# Patient Record
Sex: Female | Born: 1955 | Race: White | Hispanic: No | Marital: Married | Smoking: Never smoker
Health system: Southern US, Community
[De-identification: ages and names within clinical notes are randomized; demographics above are authoritative.]

## PROBLEM LIST (undated history)

## (undated) DIAGNOSIS — C50919 Malignant neoplasm of unspecified site of unspecified female breast: Secondary | ICD-10-CM

## (undated) DIAGNOSIS — M069 Rheumatoid arthritis, unspecified: Secondary | ICD-10-CM

## (undated) HISTORY — PX: BREAST LUMPECTOMY: SHX2

## (undated) HISTORY — DX: Malignant neoplasm of unspecified site of unspecified female breast: C50.919

## (undated) HISTORY — PX: VAGINAL HYSTERECTOMY: SUR661

## (undated) HISTORY — DX: Rheumatoid arthritis, unspecified: M06.9

---

## 2015-08-15 ENCOUNTER — Telehealth: Payer: Self-pay | Admitting: Oncology

## 2015-08-15 NOTE — Telephone Encounter (Signed)
new patient appt-s/w patient and gave np appt for 02/08 @ 4 w/Dr. Jana Hakim.  Patient new to area from [place] scanned under media. Spoke with Katharine Look @ [facility] to request additional medical records [phone].

## 2015-08-19 ENCOUNTER — Telehealth: Payer: Self-pay | Admitting: Oncology

## 2015-08-19 NOTE — Telephone Encounter (Signed)
Spoke with [facility] and they have sent all of the records on the pt that they have. Office confirmed that mm in notes was not completed by their office but a Dr. Mack Hook and that pt had no chem TX under their care.

## 2015-08-20 ENCOUNTER — Other Ambulatory Visit: Payer: Self-pay

## 2015-08-20 DIAGNOSIS — C50919 Malignant neoplasm of unspecified site of unspecified female breast: Secondary | ICD-10-CM

## 2015-08-21 ENCOUNTER — Ambulatory Visit (HOSPITAL_BASED_OUTPATIENT_CLINIC_OR_DEPARTMENT_OTHER): Payer: 59 | Admitting: Oncology

## 2015-08-21 ENCOUNTER — Other Ambulatory Visit: Payer: Self-pay | Admitting: *Deleted

## 2015-08-21 ENCOUNTER — Other Ambulatory Visit (HOSPITAL_BASED_OUTPATIENT_CLINIC_OR_DEPARTMENT_OTHER): Payer: 59

## 2015-08-21 ENCOUNTER — Encounter: Payer: Self-pay | Admitting: Oncology

## 2015-08-21 VITALS — BP 130/80 | HR 85 | Temp 98.4°F | Resp 18 | Ht 68.0 in | Wt 182.7 lb

## 2015-08-21 DIAGNOSIS — M05742 Rheumatoid arthritis with rheumatoid factor of left hand without organ or systems involvement: Secondary | ICD-10-CM

## 2015-08-21 DIAGNOSIS — Z853 Personal history of malignant neoplasm of breast: Secondary | ICD-10-CM

## 2015-08-21 DIAGNOSIS — M05741 Rheumatoid arthritis with rheumatoid factor of right hand without organ or systems involvement: Secondary | ICD-10-CM

## 2015-08-21 DIAGNOSIS — M069 Rheumatoid arthritis, unspecified: Secondary | ICD-10-CM

## 2015-08-21 DIAGNOSIS — C50919 Malignant neoplasm of unspecified site of unspecified female breast: Secondary | ICD-10-CM

## 2015-08-21 DIAGNOSIS — C50411 Malignant neoplasm of upper-outer quadrant of right female breast: Secondary | ICD-10-CM

## 2015-08-21 LAB — COMPREHENSIVE METABOLIC PANEL
ALT: 23 U/L (ref 0–55)
ANION GAP: 10 meq/L (ref 3–11)
AST: 18 U/L (ref 5–34)
Albumin: 4.2 g/dL (ref 3.5–5.0)
Alkaline Phosphatase: 93 U/L (ref 40–150)
BUN: 14.6 mg/dL (ref 7.0–26.0)
CHLORIDE: 106 meq/L (ref 98–109)
CO2: 25 meq/L (ref 22–29)
Calcium: 9.3 mg/dL (ref 8.4–10.4)
Creatinine: 0.7 mg/dL (ref 0.6–1.1)
EGFR: 89 mL/min/{1.73_m2} — AB (ref >90)
Glucose: 109 mg/dl (ref 70–140)
Potassium: 4.2 mEq/L (ref 3.5–5.1)
Sodium: 141 mEq/L (ref 136–145)
Total Bilirubin: <0.3 mg/dL (ref 0.20–1.20)
Total Protein: 6.6 g/dL (ref 6.4–8.3)

## 2015-08-21 LAB — CBC WITH DIFFERENTIAL/PLATELET
BASO%: 0.6 % (ref 0.0–2.0)
BASOS ABS: 0 10*3/uL (ref 0.0–0.1)
EOS ABS: 0.1 10*3/uL (ref 0.0–0.5)
EOS%: 1.1 % (ref 0.0–7.0)
HCT: 39.3 % (ref 34.8–46.6)
HEMOGLOBIN: 12.9 g/dL (ref 11.6–15.9)
LYMPH%: 35.2 % (ref 14.0–49.7)
MCH: 28.2 pg (ref 25.1–34.0)
MCHC: 32.8 g/dL (ref 31.5–36.0)
MCV: 86.1 fL (ref 79.5–101.0)
MONO#: 0.6 10*3/uL (ref 0.1–0.9)
MONO%: 7.9 % (ref 0.0–14.0)
NEUT%: 55.2 % (ref 38.4–76.8)
NEUTROS ABS: 4.3 10*3/uL (ref 1.5–6.5)
PLATELETS: 287 10*3/uL (ref 145–400)
RBC: 4.57 10*6/uL (ref 3.70–5.45)
RDW: 12.9 % (ref 11.2–14.5)
WBC: 7.8 10*3/uL (ref 3.9–10.3)
lymph#: 2.7 10*3/uL (ref 0.9–3.3)

## 2015-08-21 NOTE — Progress Notes (Signed)
[place]  [phone] [phone]     ID: Jaclyn Vaughan DOB: 03/14/1956  MR#: [id number]  LPF#:[id number]  Patient Care Team: Chauncey Cruel, MD as Consulting Physician (Oncology) Ted Mcalpine, MD (Internal Medicine) PCP: No primary care provider on file. GYN: SU:  OTHER MD:  CHIEF COMPLAINT: Estrogen receptor positive breast cancer  CURRENT TREATMENT: Observation   BREAST CANCER HISTORY: Jaclyn Vaughan had a spiculated lesion noted in her right breast by routine mammography February 2011. Further imaging including MRI showed only a single focus measuring 1.3 cm and biopsy 10/14/2009 confirmed invasive ductal carcinoma, grade 1, and DCIS. Margins were very close. Estrogen and progesterone receptors were positive. HER-2 was negative. On 11/14/2009 she underwent right lumpectomy with sentinel lymph node biopsy with negative margins and negative lymph nodes. -- This information comes from Dr Dan Europe 05/13/2011 note from [facility]. I do not have the actual pathology  The patient received adjuvant radiation and then started anastrozole June 2011. At that time the patient was 60 years old, her estradiol level was less than 20 and her [name] was more than 75. She had a normal bone density at baseline. She continued letrozole through June 2016, when it was discontinued.  Her subsequent history is as detailed below  INTERVAL HISTORY: [name] recently moved to [place] to be closer to family. She was evaluated in the breast clinic 08/21/2015. She is establishing herself on my service today.  REVIEW OF SYSTEMS: She has a history of rheumatoid arthritis which was treated with rituximab at [facility]. She tells me this was very successful and she has been off medication for 5 years with no significant pains. Recently she and her husband travel to [place] where they did quite a bit of walking. She found that when there was a slope she was  significantly short of breath. She had no anginal symptoms. She was not aware of palpitations. She does have some arthritic changes of course and she has a history of depression, which is not currently active. A detailed review of systems today was otherwise entirely negative.  PAST MEDICAL HISTORY: Past Medical History  Diagnosis Date  . Breast cancer ([place])   . Rheumatoid arthritis ([place])     PAST SURGICAL HISTORY: Past Surgical History  Procedure Laterality Date  . Breast lumpectomy Right   . Vaginal hysterectomy      No salpingo-oophorectomy    FAMILY HISTORY No family history on file. The patient's father died from a heart attack at the age of 23, in the setting of continuing tobacco abuse. The patient's mother died after undergoing cholecystectomy in the setting of morbid obesity. The patient has 4 brothers, one sister. Her sister had triple negative breast cancer diagnosed in her early 23s. She has been tested for the BRCA genes and was negative  GYNECOLOGIC HISTORY:  No LMP recorded. Menarche age 33, first live birth age 9, the patient is Jaclyn Vaughan. She underwent simple hysterectomy in 2005. She did not take hormone replacement.   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 Her husband Jaclyn Vaughan was Engineer, maintenance of a Media planner company but is currently retired. Son Jaclyn Vaughan lives in [place] where he works as a Manufacturing engineer. Jaclyn Vaughan lives in [place] and works as a Environmental education officer. The patient has no grandchildren. She attends the [employer] DIRECTIVES: Not in  place   HEALTH MAINTENANCE: Social History  Substance Use Topics  . Smoking status: Not on file  . Smokeless tobacco: Not on file  . Alcohol Use: Not on file     Colonoscopy: Never  YTK:[id number] post hysterectomy  Bone density: Remote  Lipid panel:  No Known  Allergies  No current outpatient prescriptions on file.   No current facility-administered medications for this visit.    OBJECTIVE: Middle-aged white woman who appears stated age  60 Vitals:   08/21/15 1615  BP: 130/80  Vaughan: 85  Temp: 98.4 F (36.9 C)  Resp: 18     Body mass index is 27.79 kg/(m^2).    ECOG FS:1 - Symptomatic but completely ambulatory  Ocular: Sclerae unicteric, pupils equal, round and reactive to light Ear-nose-throat: Oropharynx clear and moist Lymphatic: No cervical or supraclavicular adenopathy Lungs no rales or rhonchi, good excursion bilaterally Heart regular rate and rhythm, no murmur appreciated Abd soft, nontender, positive bowel sounds MSK no focal spinal tenderness, bilateral MCP/PIP joint deformities consistent with RA, without erythema or tenderness  Neuro: non-focal, well-oriented, appropriate affect Breasts: The right breast is status post lumpectomy and radiation. The cosmetic result is good. There is no evidence of local recurrence. The right axilla is benign. The left breast is unremarkable   LAB RESULTS:  CMP     Component Value Date/Time   NA 141 08/21/2015 1603   K 4.2 08/21/2015 1603   CO2 25 08/21/2015 1603   GLUCOSE 109 08/21/2015 1603   BUN 14.6 08/21/2015 1603   CREATININE 0.7 08/21/2015 1603   CALCIUM 9.3 08/21/2015 1603   PROT 6.6 08/21/2015 1603   ALBUMIN 4.2 08/21/2015 1603   AST 18 08/21/2015 1603   ALT 23 08/21/2015 1603   ALKPHOS 93 08/21/2015 1603   BILITOT <0.30 08/21/2015 1603    INo results found for: SPEP, UPEP  Lab Results  Component Value Date   WBC 7.8 08/21/2015   NEUTROABS 4.3 08/21/2015   HGB 12.9 08/21/2015   HCT 39.3 08/21/2015   MCV 86.1 08/21/2015   PLT 287 08/21/2015      Chemistry      Component Value Date/Time   NA 141 08/21/2015 1603   K 4.2 08/21/2015 1603   CO2 25 08/21/2015 1603   BUN 14.6 08/21/2015 1603   CREATININE 0.7 08/21/2015 1603      Component Value Date/Time    CALCIUM 9.3 08/21/2015 1603   ALKPHOS 93 08/21/2015 1603   AST 18 08/21/2015 1603   ALT 23 08/21/2015 1603   BILITOT <0.30 08/21/2015 1603       No results found for: LABCA2  No components found for: LABCA125  No results for input(s): INR in the last 168 hours.  Urinalysis No results found for: COLORURINE, APPEARANCEUR, LABSPEC, PHURINE, GLUCOSEU, HGBUR, BILIRUBINUR, KETONESUR, PROTEINUR, UROBILINOGEN, NITRITE, LEUKOCYTESUR  STUDIES: No results found.  ASSESSMENT: 60 y.o. [place] woman status post right lumpectomy and sentinel lymph node sampling May 2011 for a pT1c, pN0, stage IA invasive ductal carcinoma, grade 1, estrogen and progesterone receptor positive, HER-2 not amplified  (a) status post adjuvant radiation  (b) on letrozole between June 2011 and June 2016  (1) rheumatoid arthritis, status post rituximab treatment at [facility], currently not on therapy  (2) status post hysterectomy without salpingo-oophorectomy  PLAN: I spent approximately 50 minutes with Jaclyn Vaughan reviewing her data. She had thought she did not have invasive disease and did not remember that lymph nodes were removed from the  right axilla. We discussed the difference between invasive and noninvasive disease and she understands all the word "invasive" means is that the cancer cells which started in a Dr. got out of the duct. It does not mean the cancer was invading all over her body at the time of diagnosis.  After completing local treatment with surgery and radiation, the options for systemic therapy included chemotherapy and anti-estrogens. Because her cancer was not only small but also grade 1, chemotherapy was appropriately not commended. Today we would've sent an Oncotype and likely would have confirmed that this was a "low-risk" tumor and that chemotherapy would have reduce her risk of recurrence minimally if at all of course she was not a candidate for anti-HER-2 treatment  She was treated with  letrozole for 5 years. This essentially cut in half the risk of local recurrence, the risk of systemic recurrence, and also the risk of her developing another breast cancer in either breast.  We now have data for using aromatase inhibitors an additional 5 years. This further reduce his risk by approximately 3%. However only part of that risk reduction is in systemic recurrence. The actual benefit in that sense is only 1%. Accordingly I am very comfortable with her having stopped letrozole at 5 years.  At this point she needs to update her health maintenance and the first step will be to establish yourself with a primary care physician in this area. She is planning to contact her husband's primary care physician.once that is done she should be referred for colonoscopy, which she has not yet had. It would not be a bad idea for her to have formal dermatologic follow-up as well since she is a fair woman living in the [place]. She does not need gynecologic follow-up being status post   hysterectomy  She also needs to establish herself with a rheumatologist. She had a very good rapport with Dr. Elwyn Reach at [facility] and I will see if his clinic would agree to an appointment for her with.  The only concern I have after today's visit is that she does report some shortness of breath with mild to moderate exertion. I'm going to obtain a chest x-ray which will serve as baseline. She also needs a bone density and mammography with tomography and all those have been operationalized.  I have set her up to see me again in October, but if she is established with her physicians and everything is in order she may cancel that appointment: I usually do not follow low-risk patients like herself beyond the first 5 years or until they complete their antiestrogen treatment  The patient has a good understanding of the overall plan. She agrees with it. She knows the goal of treatment in her case is cure. She will call with any problems  that may develop before her next visit here.  Chauncey Cruel, MD   08/21/2015 6:06 PM Medical Oncology and Hematology [facility] [address] [place], [place] [address] Tel. [phone]    Fax. [phone]

## 2015-08-22 ENCOUNTER — Other Ambulatory Visit: Payer: Self-pay | Admitting: *Deleted

## 2015-08-22 ENCOUNTER — Telehealth: Payer: Self-pay | Admitting: *Deleted

## 2015-08-22 NOTE — Telephone Encounter (Signed)
This RN contacted Dr Elwyn Reach - rheumatologist at [facility] to obtain appointment at [phone].  This RN spoke with Nevin Bloodgood in scheduling who noted pt last seen by this MD in 2011.  Requested referral to be faxed to [phone] with note pt previously seen by above MD.  This RN faxed requested records to above.

## 2015-08-23 ENCOUNTER — Other Ambulatory Visit: Payer: Self-pay | Admitting: Oncology

## 2015-08-23 DIAGNOSIS — C50411 Malignant neoplasm of upper-outer quadrant of right female breast: Secondary | ICD-10-CM

## 2015-08-24 ENCOUNTER — Telehealth: Payer: Self-pay | Admitting: Oncology

## 2015-08-24 NOTE — Telephone Encounter (Signed)
Spoke with patient re f/u 8/17. Request for appointment with rheumatologist at [facility] has already been initiated by desk nurse see previous nurses note copied below - patient aware.      Laureen Abrahams, RN at 08/22/2015 3:16 PM     Status: Signed       Expand All Collapse All   This RN contacted Dr Elwyn Reach - rheumatologist at [facility] to obtain appointment at [phone].  This RN spoke with Nevin Bloodgood in scheduling who noted pt last seen by this MD in 2011.  Requested referral to be faxed to [phone] with note pt previously seen by above MD.  This RN faxed requested records to above.

## 2015-09-12 ENCOUNTER — Inpatient Hospital Stay: Admission: RE | Admit: 2015-09-12 | Payer: 59 | Source: Ambulatory Visit

## 2015-09-12 ENCOUNTER — Ambulatory Visit
Admission: RE | Admit: 2015-09-12 | Discharge: 2015-09-12 | Disposition: A | Discharge status: Home / Self Care | Payer: 59 | Source: Ambulatory Visit | Attending: Oncology | Admitting: Oncology

## 2015-09-12 DIAGNOSIS — C50411 Malignant neoplasm of upper-outer quadrant of right female breast: Secondary | ICD-10-CM

## 2015-10-03 ENCOUNTER — Other Ambulatory Visit: Payer: 59

## 2015-10-09 ENCOUNTER — Ambulatory Visit (INDEPENDENT_AMBULATORY_CARE_PROVIDER_SITE_OTHER): Payer: 59 | Admitting: Podiatry

## 2015-10-09 ENCOUNTER — Encounter: Payer: Self-pay | Admitting: Podiatry

## 2015-10-09 ENCOUNTER — Ambulatory Visit (HOSPITAL_BASED_OUTPATIENT_CLINIC_OR_DEPARTMENT_OTHER)
Admission: RE | Admit: 2015-10-09 | Discharge: 2015-10-09 | Disposition: A | Discharge status: Home / Self Care | Payer: 59 | Source: Ambulatory Visit | Attending: Podiatry | Admitting: Podiatry

## 2015-10-09 DIAGNOSIS — R52 Pain, unspecified: Secondary | ICD-10-CM | POA: Diagnosis not present

## 2015-10-09 NOTE — Progress Notes (Signed)
Subjective:    Patient ID: Jaclyn Vaughan, female    DOB: [birth year], 60 y.o.   MRN: [id number]  HPI  60 year old female presents the office today for concerns of her left foot. She states the majority of her pain is overlying the fifth toe as it is curving outwards putting pressure in her shoes. She says this limits her shoes and she has pain on a daily basis of this toe. She has tried a splint the toe with this made the pain worse. She also has a bunion which is painful in shoes and she has tried changing shoes without much relief. Her second toe also gets irritated in shoes due to the hammertoe and this causes pain. These issues she has tried padding, offloading in shoe gear modifications without any relief of symptoms. At this time should discuss surgical intervention.  She does have rheumatoid arthritis however she states that she has been in "remission" for the last several years and she has not been taking any medication for this other than an over-the-counter anti-inflammatory if needed. She has a history of breast cancer but this is also in remission.  No other complaints at this time.   Review of Systems  All other systems reviewed and are negative.      Objective:   Physical Exam General: AAO x3, NAD  Dermatological: Skin is warm, dry and supple bilateral. Nails x 10 are well manicured; remaining integument appears unremarkable at this time. There are no open sores, no preulcerative lesions, no rash or signs of infection present.  Vascular: Dorsalis Pedis artery and Posterior Tibial artery pedal pulses are 2/4 bilateral with immedate capillary fill time. Pedal hair growth present. No varicosities and no lower extremity edema present bilateral. There is no pain with calf compression, swelling, warmth, erythema.   Neruologic: Grossly intact via light touch bilateral. Vibratory intact via tuning fork bilateral. Protective threshold with Semmes Wienstein monofilament intact to all  pedal sites bilateral. Patellar and Achilles deep tendon reflexes 2+ bilateral. No Babinski or clonus noted bilateral.   Musculoskeletal: There is moderate HAV present on the left side and is worse than on the right. There is irritation of the medial aspect of the first metatarsal head from shoe gear. There is no pain or crepitation first MTPJ range of motion. There is no mobility present. There is also a semirigid hammertoe contracture the second toe. There is erythema over the dorsal PIPJ from shoe gear. There is tenderness overlying this area. The fifth digit at the level of the PIPJ states it almost to 90 decreased angle the distal part of the toe was sitting laterally. This is rigid. There is tenderness overlying this toe. The lesser digits are also being somewhat laterally deviated. The left side is worse than the right. There is no tenderness along the third and fourth toes. There is no other areas of tenderness to bilateral lower extremities. MMT 5/5.  Gait: Unassisted, Nonantalgic.      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 -Treatment options discussed including all alternatives, risks, and complications -X-rays were ordered and reviewed with the patient. -Etiology of symptoms were discussed -I discussed with her both conservative and surgical treatment options. At this time she is attended multiple conservative treatments without any relief of symptoms and she is requesting surgical intervention. I had a long discussion with her in regards to various surgical treatment options. Given her history of rheumatoid arthritis discussed there is  a strong possibility that she may need further surgery in the future. She understands this. After discussing treatment options we have decided on left foot Austin bunionectomy with screw fixation, second digit PIPJ arthrodesis with k-wire and fifth digit PIPJ arthroplasty with possible K wire.    -The incision placement as well as the postoperative course was discussed with the patient. I discussed risks of the surgery which include, but not limited to, infection, bleeding, pain, swelling, need for further surgery, delayed or nonhealing, painful or ugly scar, numbness or sensation changes, over/under correction, recurrence, transfer lesions, further deformity, hardware failure, DVT/PE, loss of toe/foot. Given the rotation of the fifth toe the toe could go into neurovascular spasm due to the derotation which could lead to loss of the toe. She understands this. Patient understands these risks and wishes to proceed with surgery. The surgical consent was reviewed with the patient all 3 pages were signed. No promises or guarantees were given to the outcome of the procedure. All questions were answered to the best of my ability. Before the surgery the patient was encouraged to call the office if there is any further questions. The surgery will be performed at the [facility] on an outpatient basis.  Celesta Gentile, DPM

## 2015-10-17 ENCOUNTER — Telehealth: Payer: Self-pay | Admitting: *Deleted

## 2015-10-17 NOTE — Telephone Encounter (Signed)
"  I went to see Dr. Jacqualyn Posey in the [facility] office.  I scheduled surgery for April 19.  Lattie Haw said that you would call me.  It's been about 2 weeks and I hadn't received a call.  I don't know if it has been scheduled or what time I'm supposed to be there."   You are scheduled for April 19.  I can't give you an arrival time because the surgical center may have cancellations or have patients with medical issues that need to go first.  They will call you a day or two prior to surgery date with the arrival time.  "Oh, well it was nice talking to you."  Make sure you register with the surgical center.  "She didn't tell me anything about that.  How do I go about doing that?"  You should have received a brochure from [facility], instructions are in it to do it on-line or you can call.  "Okay great, thank you."

## 2015-10-30 ENCOUNTER — Encounter: Payer: Self-pay | Admitting: Podiatry

## 2015-10-30 DIAGNOSIS — M2042 Other hammer toe(s) (acquired), left foot: Secondary | ICD-10-CM | POA: Diagnosis not present

## 2015-10-30 DIAGNOSIS — M2012 Hallux valgus (acquired), left foot: Secondary | ICD-10-CM | POA: Diagnosis not present

## 2015-10-31 ENCOUNTER — Telehealth: Payer: Self-pay | Admitting: *Deleted

## 2015-10-31 NOTE — Telephone Encounter (Addendum)
Post op courtesy call-I spoke with pt, she states she's doing great, the leg block started to decrease this am and she is using the Ibuprofen and has no pain.  I encouraged pt to RICE, not to be up on her feet more than 5 minutes/hour 1st seek post op, must be in the boot at all times, but can open and rotate ankle if resting and not going to sleep, keep dressing in place and dry and to call our office with concerns.  Pt states understanding.  11/04/2015-Pt states she's doing great, and I reminded her of her appt [place] 11/06/2015 at 245pm.

## 2015-11-04 NOTE — Progress Notes (Signed)
DOS 10/30/2015 Left foot Bunion correction with screw fixation, hammertoe repair of toes 2nd and 5th with possible pin fixation.

## 2015-11-06 ENCOUNTER — Ambulatory Visit (INDEPENDENT_AMBULATORY_CARE_PROVIDER_SITE_OTHER): Payer: 59 | Admitting: Podiatry

## 2015-11-06 ENCOUNTER — Ambulatory Visit (HOSPITAL_BASED_OUTPATIENT_CLINIC_OR_DEPARTMENT_OTHER)
Admission: RE | Admit: 2015-11-06 | Discharge: 2015-11-06 | Disposition: A | Discharge status: Home / Self Care | Payer: 59 | Source: Ambulatory Visit | Attending: Podiatry | Admitting: Podiatry

## 2015-11-06 DIAGNOSIS — Z09 Encounter for follow-up examination after completed treatment for conditions other than malignant neoplasm: Secondary | ICD-10-CM | POA: Insufficient documentation

## 2015-11-06 DIAGNOSIS — M2042 Other hammer toe(s) (acquired), left foot: Secondary | ICD-10-CM

## 2015-11-06 DIAGNOSIS — M2012 Hallux valgus (acquired), left foot: Secondary | ICD-10-CM

## 2015-11-11 DIAGNOSIS — M2010 Hallux valgus (acquired), unspecified foot: Secondary | ICD-10-CM | POA: Insufficient documentation

## 2015-11-11 DIAGNOSIS — Z09 Encounter for follow-up examination after completed treatment for conditions other than malignant neoplasm: Secondary | ICD-10-CM | POA: Insufficient documentation

## 2015-11-11 DIAGNOSIS — M2040 Other hammer toe(s) (acquired), unspecified foot: Secondary | ICD-10-CM | POA: Insufficient documentation

## 2015-11-11 NOTE — Progress Notes (Signed)
Patient ID: Jaclyn Vaughan, female   DOB: 10/07/1955, 60 y.o.   MRN: [id number]  Subjective: Jaclyn Vaughan is a 60 y.o. is seen today in office s/p left Austin/Akin and hammertoe repair of 2 and 5 preformed on 10/30/15. She states that she is not having much pain and she is only taking ibuprofen. She is just about completed with her course of antibiotics. She's remain in a cam boot. Denies any systemic complaints such as fevers, chills, nausea, vomiting. No calf pain, chest pain, shortness of breath.   Objective: General: No acute distress, AAOx3  DP/PT pulses palpable 2/4, CRT < 3 sec to all digits.  Protective sensation intact. Motor function intact.  Left foot: Incision is well coapted without any evidence of dehiscence and sutures intact. There is no surrounding erythema, ascending cellulitis, fluctuance, crepitus, malodor, drainage/purulence. There is mild edema around the surgical site. There is minimal pain along the surgical site. K wire intact the second and fifth toe. Ptosis in rectus position. No other areas of tenderness to bilateral lower extremities.  No other open lesions or pre-ulcerative lesions.  No pain with calf compression, swelling, warmth, erythema.   Assessment and Plan:  Status post left foot surgery, doing well with no complications   -Treatment options discussed including all alternatives, risks, and complications -X-rays were ordered and reviewed. I may remove the k-wire to the 5th toe next appointment as it does not appear it is holding.  -Antibiotic on it was applied followed by dressing. Keep dressing clean, dry, intact. -Continue cam boot. -Ice/elevation -Pain medication as needed. -Monitor for any clinical signs or symptoms of infection and DVT/PE and directed to call the office immediately should any occur or go to the ER. -Follow-up in 1 week for POSSIBLE suture removal or sooner if any problems arise. In the meantime, encouraged to call the office with any  questions, concerns, change in symptoms.   Celesta Gentile, DPM

## 2015-11-13 ENCOUNTER — Ambulatory Visit (INDEPENDENT_AMBULATORY_CARE_PROVIDER_SITE_OTHER): Payer: 59 | Admitting: Podiatry

## 2015-11-13 DIAGNOSIS — M2042 Other hammer toe(s) (acquired), left foot: Secondary | ICD-10-CM

## 2015-11-13 DIAGNOSIS — M2012 Hallux valgus (acquired), left foot: Secondary | ICD-10-CM

## 2015-11-14 NOTE — Progress Notes (Signed)
Patient ID: Christl Alcaraz, female   DOB: May 02, 1956, 60 y.o.   MRN: [id number]  Subjective: Leigha Mccary is a 60 y.o. is seen today in office s/p left Austin/Akin and hammertoe repair of 2 and 5 preformed on 10/30/15. She states that she is not having much pain and she is only taking ibuprofen. She has completed antibiotics. She's remain in a cam boot. Denies any systemic complaints such as fevers, chills, nausea, vomiting. No calf pain, chest pain, shortness of breath.   Objective: General: No acute distress, AAOx3  DP/PT pulses palpable 2/4, CRT < 3 sec to all digits.  Protective sensation intact. Motor function intact.  Left foot: Incision is well coapted without any evidence of dehiscence and sutures intact. There is no surrounding erythema, ascending cellulitis, fluctuance, crepitus, malodor, drainage/purulence. There is mild edema around the surgical site. There is minimal pain along the surgical site. K wire intact the second and fifth toe. Toes are in rectus position. No other areas of tenderness to bilateral lower extremities.  No other open lesions or pre-ulcerative lesions.  No pain with calf compression, swelling, warmth, erythema.   Assessment and Plan:  Status post left foot surgery, doing well with no complications   -Treatment options discussed including all alternatives, risks, and complications -Sutures not quite ready to be removed. We'll do this next week. Also I will leave the pin for the fifth toe at this time. Although on x-ray may not be doing much I would just holding the toe somewhat and not move this as much as possible.  -Antibiotic on it was applied followed by dressing. Keep dressing clean, dry, intact. -Continue cam boot. -Ice/elevation -Pain medication as needed. -Monitor for any clinical signs or symptoms of infection and DVT/PE and directed to call the office immediately should any occur or go to the ER. -Follow-up in 1 week for suture removal or sooner if any  problems arise. In the meantime, encouraged to call the office with any questions, concerns, change in symptoms.   Celesta Gentile, DPM

## 2015-11-20 ENCOUNTER — Ambulatory Visit (INDEPENDENT_AMBULATORY_CARE_PROVIDER_SITE_OTHER): Payer: 59 | Admitting: Podiatry

## 2015-11-20 DIAGNOSIS — Z09 Encounter for follow-up examination after completed treatment for conditions other than malignant neoplasm: Secondary | ICD-10-CM

## 2015-11-20 DIAGNOSIS — M2012 Hallux valgus (acquired), left foot: Secondary | ICD-10-CM

## 2015-11-20 DIAGNOSIS — M2042 Other hammer toe(s) (acquired), left foot: Secondary | ICD-10-CM

## 2015-11-21 NOTE — Progress Notes (Signed)
Patient ID: Jaclyn Vaughan, female   DOB: 03/24/1956, 60 y.o.   MRN: [id number]  Subjective: Jaclyn Vaughan is a 60 y.o. is seen today in office s/p left Austin/Akin and hammertoe repair of 2 and 5 preformed on 10/30/15. She states that she is not having much pain and she is not taking any pain medicine she is no longer taking ibuprofen. She's remained  in the CAM boot. Denies any systemic complaints such as fevers, chills, nausea, vomiting. No calf pain, chest pain, shortness of breath.   Objective: General: No acute distress, AAOx3  DP/PT pulses palpable 2/4, CRT < 3 sec to all digits.  Protective sensation intact. Motor function intact.  Left foot: Incision is well coapted without any evidence of dehiscence and sutures intact. There is no surrounding erythema, ascending cellulitis, fluctuance, crepitus, malodor, drainage/purulence. There is mild edema around the surgical site. There is minimal pain along the surgical site. K wire intact the second and fifth toe.   hallux and second toe sits in rectus position. The third and fourth toes which not have surgery so same a somewhat fibular deviated position. The fifth toes in a more rectus position however it is somewhat similar deviated. The fifth toe is not at the almost right angle that it was prior to surgery. No other areas of tenderness to bilateral lower extremities.  No other open lesions or pre-ulcerative lesions.  No pain with calf compression, swelling, warmth, erythema.   Assessment and Plan:  Status post left foot surgery, doing well with no complications   -Treatment options discussed including all alternatives, risks, and complications -To the sutures were removed without complications. Antibody appointment was applied over the incisions as well as the K wire sites. Dressing was applied. Keep dressing clean, dry, intact. Discussed with the wire and the fifth toe is likely not doing too much however the toe is in a good position so we will  leave it for now. Continue the cam boot at all times. Limit weightbearing. Ibuprofen as needed. Monitor for signs or symptoms of infection and/or DVT/PE injected to go to the emergency room should any occur. -Follow-up in 3 weeks  or sooner if any problems arise. In the meantime, encouraged to call the office with any questions, concerns, change in symptoms.  *X-ray and likely pin removal next appointment.  Celesta Gentile, DPM

## 2015-12-18 ENCOUNTER — Ambulatory Visit (HOSPITAL_BASED_OUTPATIENT_CLINIC_OR_DEPARTMENT_OTHER)
Admission: RE | Admit: 2015-12-18 | Discharge: 2015-12-18 | Disposition: A | Discharge status: Home / Self Care | Payer: 59 | Source: Ambulatory Visit | Attending: Podiatry | Admitting: Podiatry

## 2015-12-18 ENCOUNTER — Ambulatory Visit (INDEPENDENT_AMBULATORY_CARE_PROVIDER_SITE_OTHER): Payer: 59 | Admitting: Podiatry

## 2015-12-18 DIAGNOSIS — M2012 Hallux valgus (acquired), left foot: Secondary | ICD-10-CM

## 2015-12-18 DIAGNOSIS — Z09 Encounter for follow-up examination after completed treatment for conditions other than malignant neoplasm: Secondary | ICD-10-CM | POA: Diagnosis not present

## 2015-12-18 DIAGNOSIS — M2042 Other hammer toe(s) (acquired), left foot: Secondary | ICD-10-CM | POA: Diagnosis not present

## 2015-12-18 NOTE — Progress Notes (Signed)
Patient ID: Jaclyn Vaughan, female   DOB: [birth year], 60 y.o.   MRN: [id number]  Subjective: Jaclyn Vaughan is a 60 y.o. is seen today in office s/p left Austin/Akin and hammertoe repair of 2 and 5 preformed on 10/30/15. She states that she is continuing metastases taking pain medicine doing well. The pins of the fifth toe came out yesterday. Continue the cam boot. She said that she is doing well having no problems.Denies any systemic complaints such as fevers, chills, nausea, vomiting. No calf pain, chest pain, shortness of breath.   Objective: General: No acute distress, AAOx3  DP/PT pulses palpable 2/4, CRT < 3 sec to all digits.  Protective sensation intact. Motor function intact.  Left foot: Incision is well coapted without any evidence of dehiscence and a scar has formed. There is no surrounding erythema, ascending cellulitis, fluctuance, crepitus, malodor, drainage/purulence. There is decreased edema around the surgical site. There is no pain along the surgical sites. K wire intact the second toe. The hallux and second toe sits in a rectus position of the third, fourth, fifth toes are laterally deviated have the fifth toe was not significantly deviated as there was prior to surgery. No other areas of tenderness to bilateral lower extremities.  No other open lesions or pre-ulcerative lesions.  No pain with calf compression, swelling, warmth, erythema.   Assessment and Plan:  Status post left foot surgery, doing well with no complications   -Treatment options discussed including all alternatives, risks, and complications -X-rays ordered and reviewed -K wire removed to the second toe in total. -She can start to shower. Wish directed to the incisions daily. Antibody appointment to the pin site. -Transition to surgical shoe. As she feels able she can transition to regular shoe over the next couple weeks. -Splint dispensed today to help hold the fifth toe to more rectus position. -Ice and  elevation -Follow-up in 4 weeks  or sooner if any problems arise. In the meantime, encouraged to call the office with any questions, concerns, change in symptoms.  *X-ray next appointment.  Celesta Gentile, DPM

## 2015-12-27 ENCOUNTER — Other Ambulatory Visit: Payer: Self-pay | Admitting: Oncology

## 2016-01-22 ENCOUNTER — Ambulatory Visit (INDEPENDENT_AMBULATORY_CARE_PROVIDER_SITE_OTHER): Payer: 59 | Admitting: Podiatry

## 2016-01-22 ENCOUNTER — Encounter: Payer: Self-pay | Admitting: Podiatry

## 2016-01-22 ENCOUNTER — Ambulatory Visit (HOSPITAL_BASED_OUTPATIENT_CLINIC_OR_DEPARTMENT_OTHER)
Admission: RE | Admit: 2016-01-22 | Discharge: 2016-01-22 | Disposition: A | Discharge status: Home / Self Care | Payer: 59 | Source: Ambulatory Visit | Attending: Podiatry | Admitting: Podiatry

## 2016-01-22 VITALS — BP 129/89 | HR 75 | Resp 16

## 2016-01-22 DIAGNOSIS — M2042 Other hammer toe(s) (acquired), left foot: Secondary | ICD-10-CM

## 2016-01-22 DIAGNOSIS — M7732 Calcaneal spur, left foot: Secondary | ICD-10-CM | POA: Insufficient documentation

## 2016-01-22 DIAGNOSIS — Z09 Encounter for follow-up examination after completed treatment for conditions other than malignant neoplasm: Secondary | ICD-10-CM | POA: Insufficient documentation

## 2016-01-22 DIAGNOSIS — R600 Localized edema: Secondary | ICD-10-CM | POA: Insufficient documentation

## 2016-01-22 DIAGNOSIS — M2012 Hallux valgus (acquired), left foot: Secondary | ICD-10-CM

## 2016-01-26 NOTE — Progress Notes (Signed)
Patient ID: Jaclyn Vaughan, female   DOB: [birth year], 60 y.o.   MRN: [id number]   Subjective: Jaclyn Vaughan is a 60 y.o. is seen today in office s/p left Austin/Akin and hammertoe repair of 2 and 5 preformed on 10/30/15. She states that she is still getting some swelling and some discomfort which is not taking any pain medicine. She is back to wearing regular shoe when she is walking about 1 mile per day. She doesn't of the fifth toe surgery of out summer but is not as bad as what it was prior to surgery. No new concerns today no acute changes otherwise.  Objective: General: No acute distress, AAOx3  DP/PT pulses palpable 2/4, CRT < 3 sec to all digits.  Protective sensation intact. Motor function intact.  Left foot: Incision is well coapted without any evidence of dehiscence and a scar has formed. There is no surrounding erythema, ascending cellulitis, fluctuance, crepitus, malodor, drainage/purulence. There is decreased edema around the surgical site.There is mild recurrence of hallux abductus as well as the fifth toe drifting outward of the second toe remains rectus. There is no tenderness on the surgical sites. Overall the foot still appears to be improved compared to what it was prior to surgery. No other areas of tenderness to bilateral lower extremities.  No other open lesions or pre-ulcerative lesions.  No pain with calf compression, swelling, warmth, erythema.   Assessment and Plan:  Status post left foot surgery, doing well with no complications   -Treatment options discussed including all alternatives, risks, and complications -X-rays ordered and reviewed -This time she can continue with a regular shoe. I showed her how to tape the toes to help hold them straight. Continue elevation and ice after activity. I will see her back as scheduled or sooner if any issues are to arise. In the meantime continue range of motion exercises as well.  Celesta Gentile, DPM

## 2016-02-27 ENCOUNTER — Encounter: Payer: Self-pay | Admitting: Oncology

## 2016-02-27 ENCOUNTER — Ambulatory Visit: Payer: 59 | Admitting: Oncology

## 2016-03-04 ENCOUNTER — Ambulatory Visit: Payer: 59 | Admitting: Podiatry

## 2017-08-07 IMAGING — DX DG FOOT COMPLETE 3+V*L*
3 series · 3 of 3 positions shown · non-contrast
Comparison: 11/06/2015

CLINICAL DATA: Followup with surgery.

EXAM:
LEFT FOOT - COMPLETE 3+ VIEW

[foot ap]
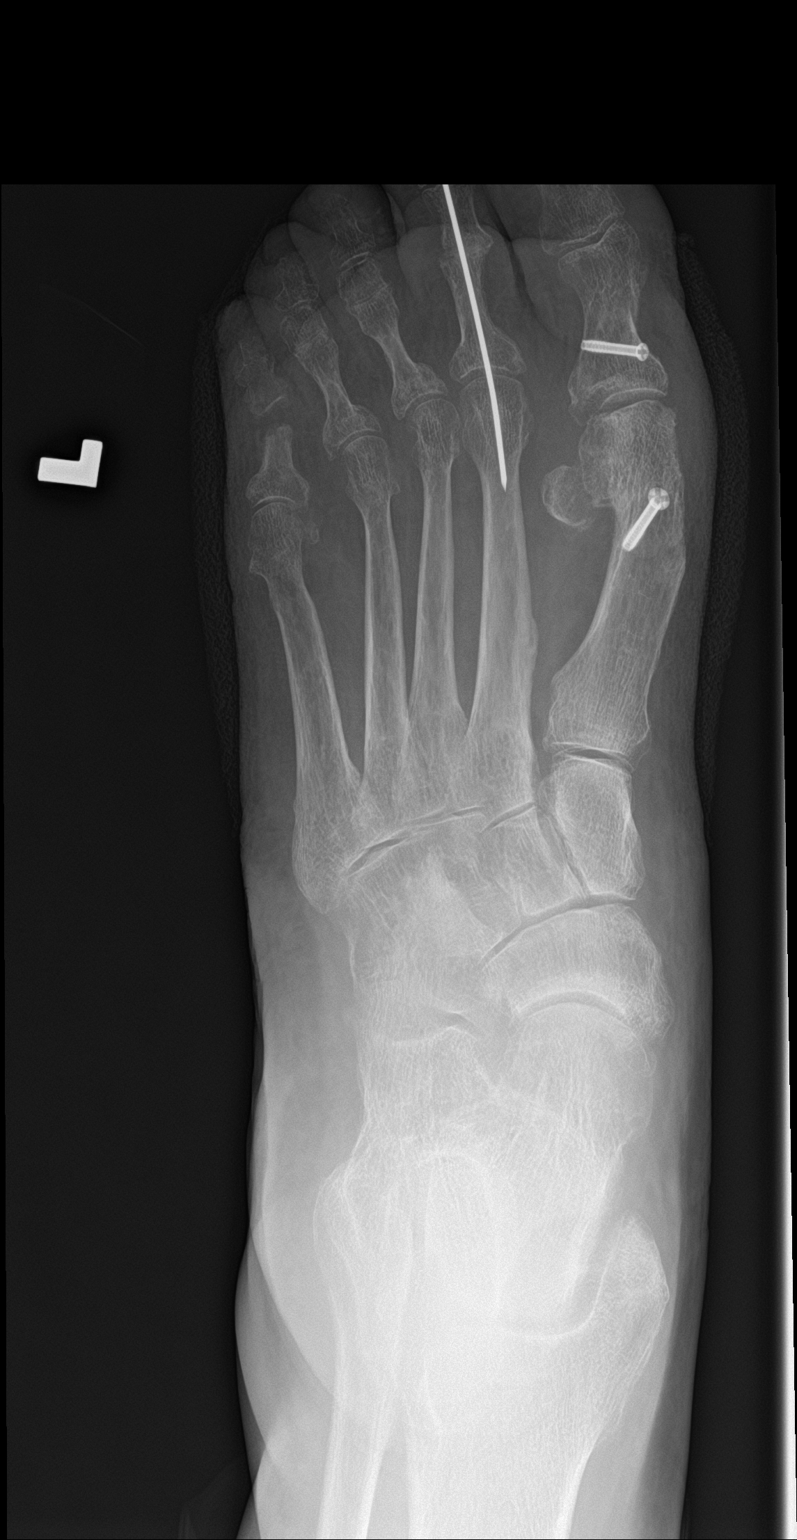

[foot obl]
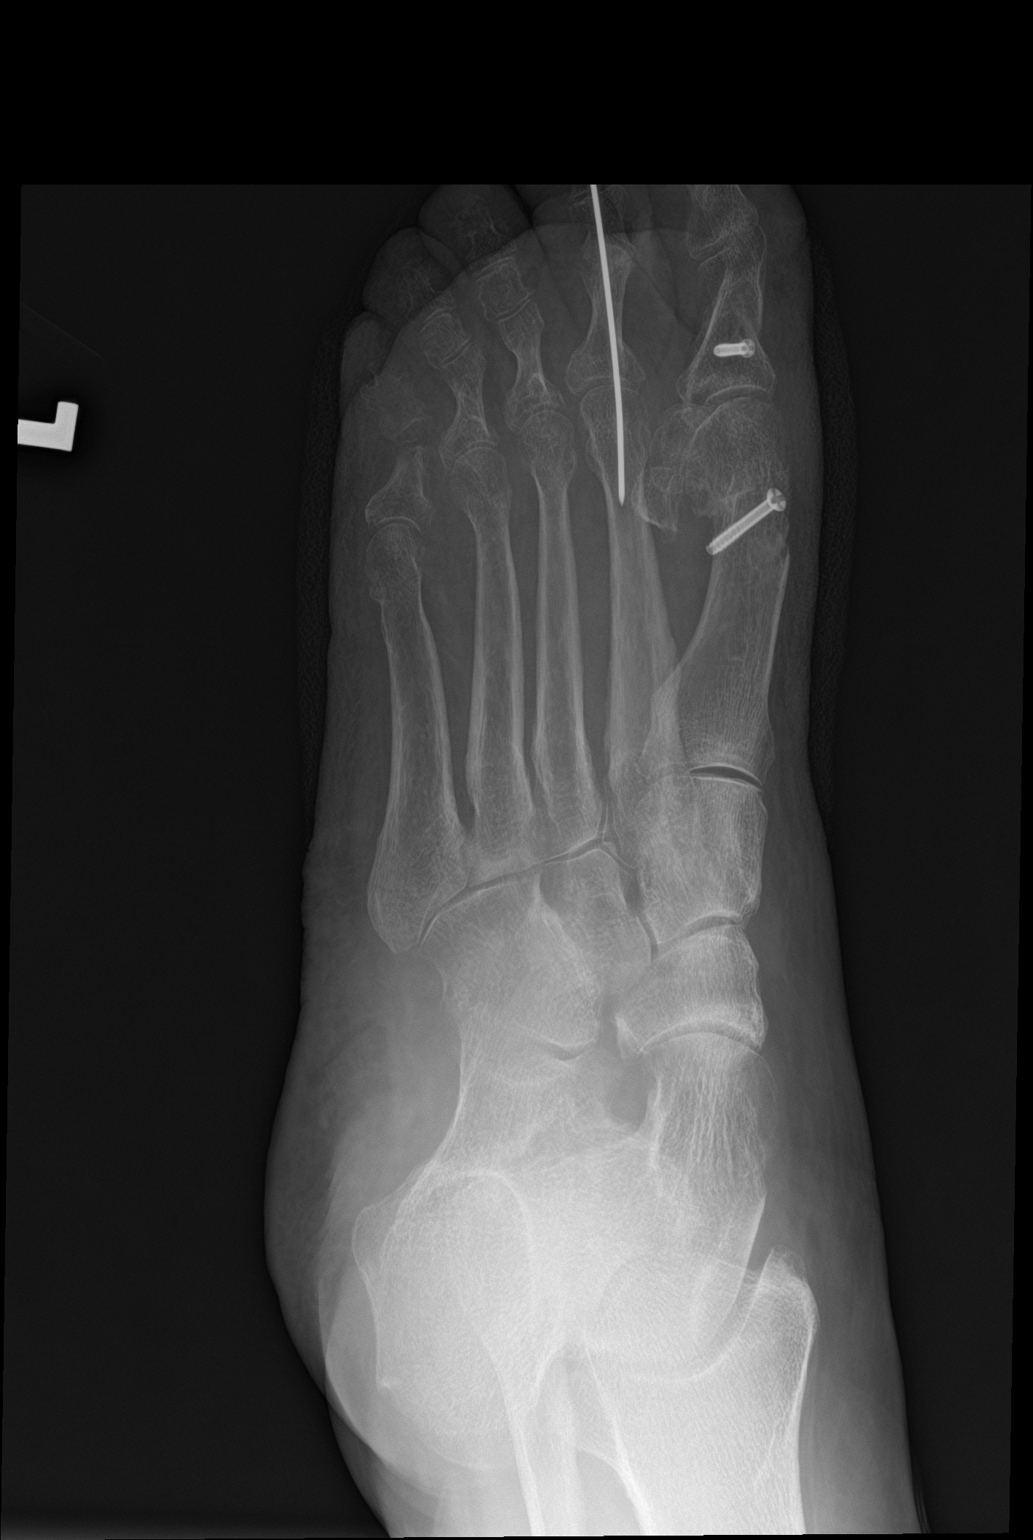

[foot lat]
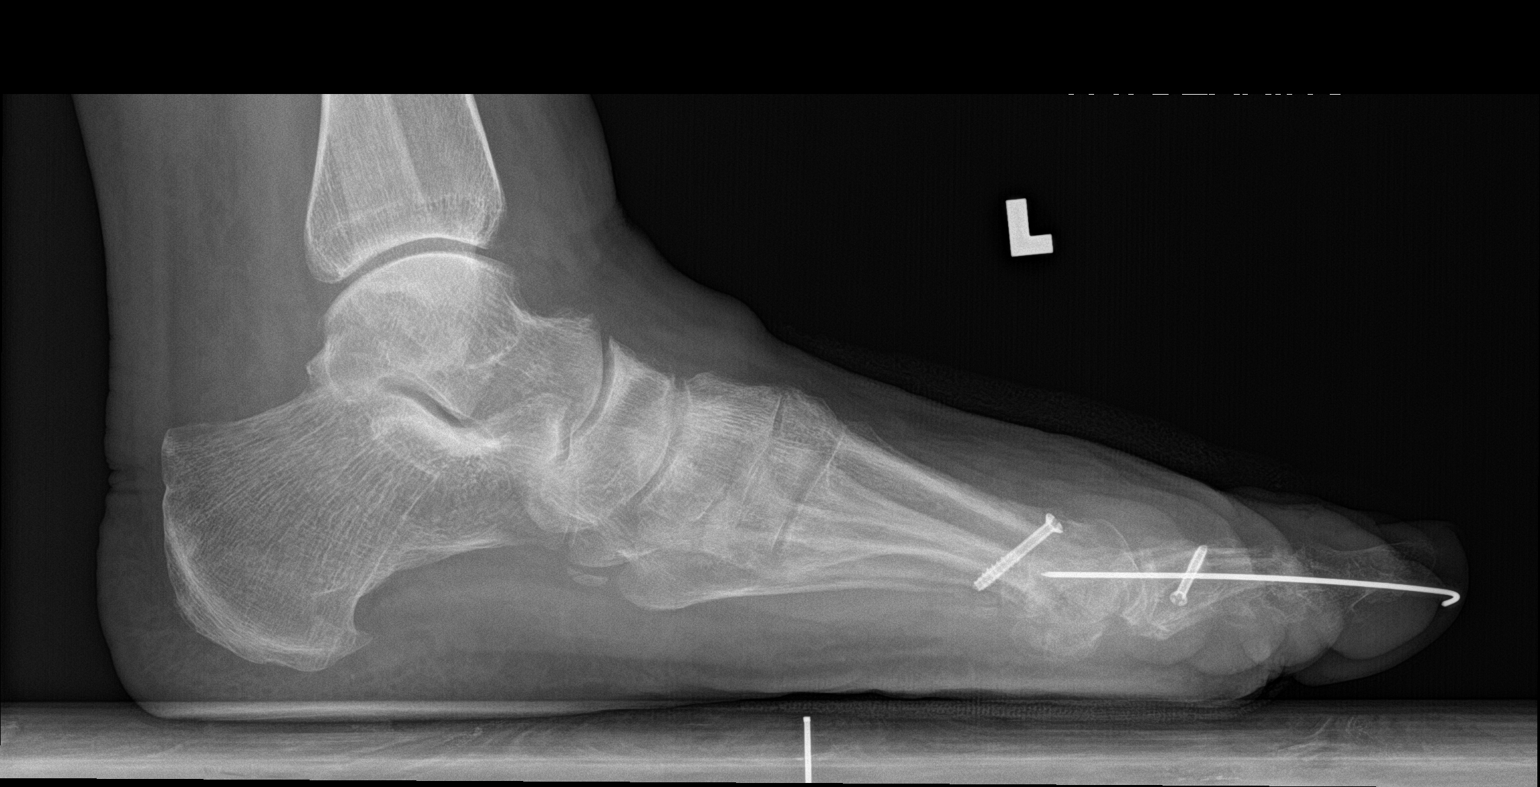

[3 of 3 positions shown; findings below may reference images not displayed]

FINDINGS: The smooth percutaneous pin has been removed from the fifth toe. The
pin in the second toe is still in place. No complicating features.
Stable surgical changes from a bunionectomy. No complicating
features are demonstrated. Incompletely healed first metatarsal
osteotomy. Moderate osteopenia noted. Small calcaneal heel spur.
IMPRESSION: Removal of the smooth pin from the fifth digit. Stable changes from
a partial resection of the proximal phalanx.

Surgical changes from a bunionectomy. No complicating features.
Incompletely healed osteotomy involving the first metatarsal.

## 2017-09-11 IMAGING — DX DG FOOT COMPLETE 3+V*L*
3 series · 3 of 3 positions shown · non-contrast
Comparison: 12/18/2015

CLINICAL DATA: Surgery on left foot 2 months ago. Hammer toe.
Rheumatoid arthritis.

EXAM:
LEFT FOOT - COMPLETE 3+ VIEW

[foot ap]
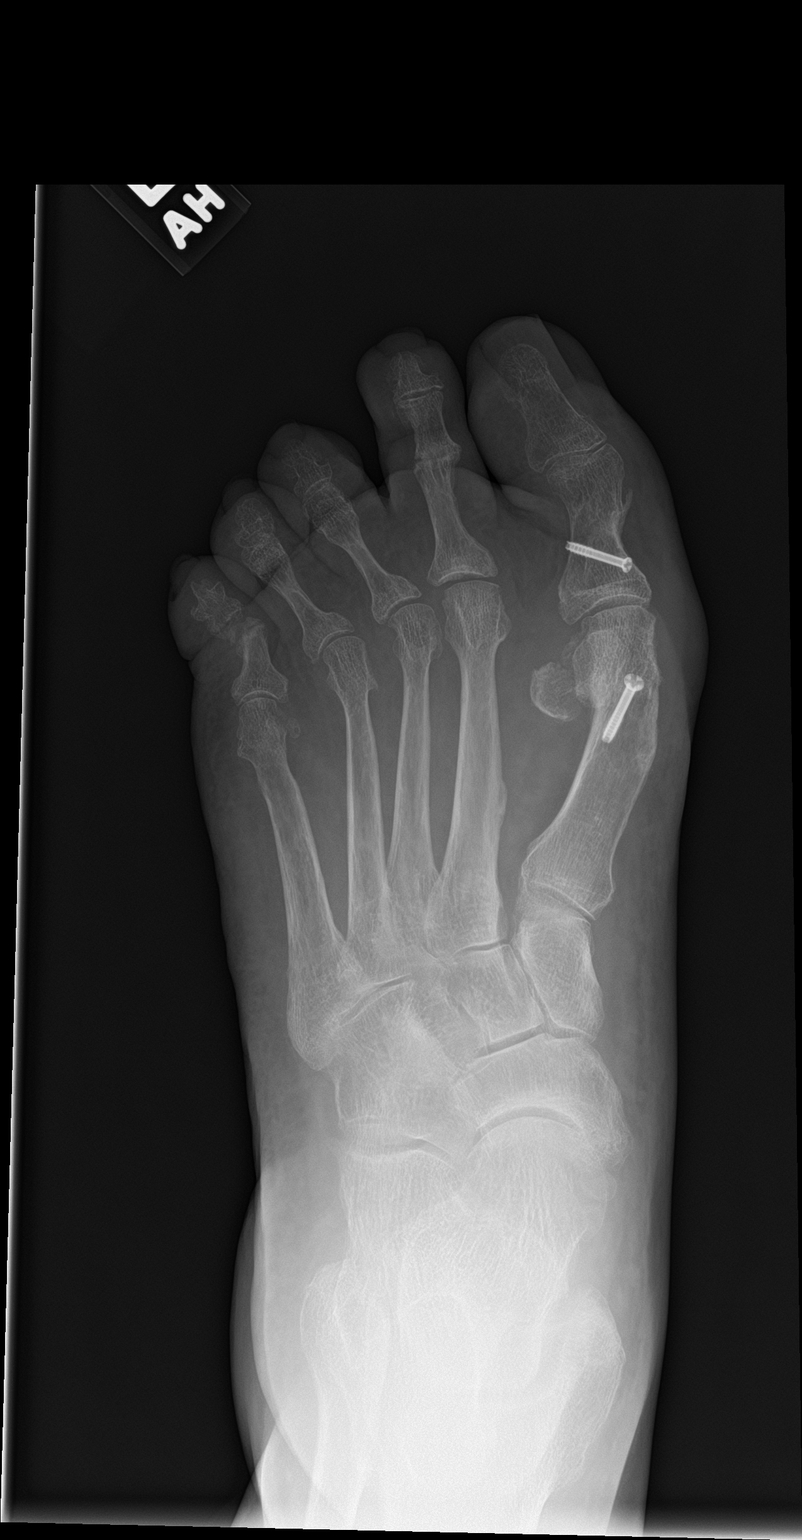

[foot obl]
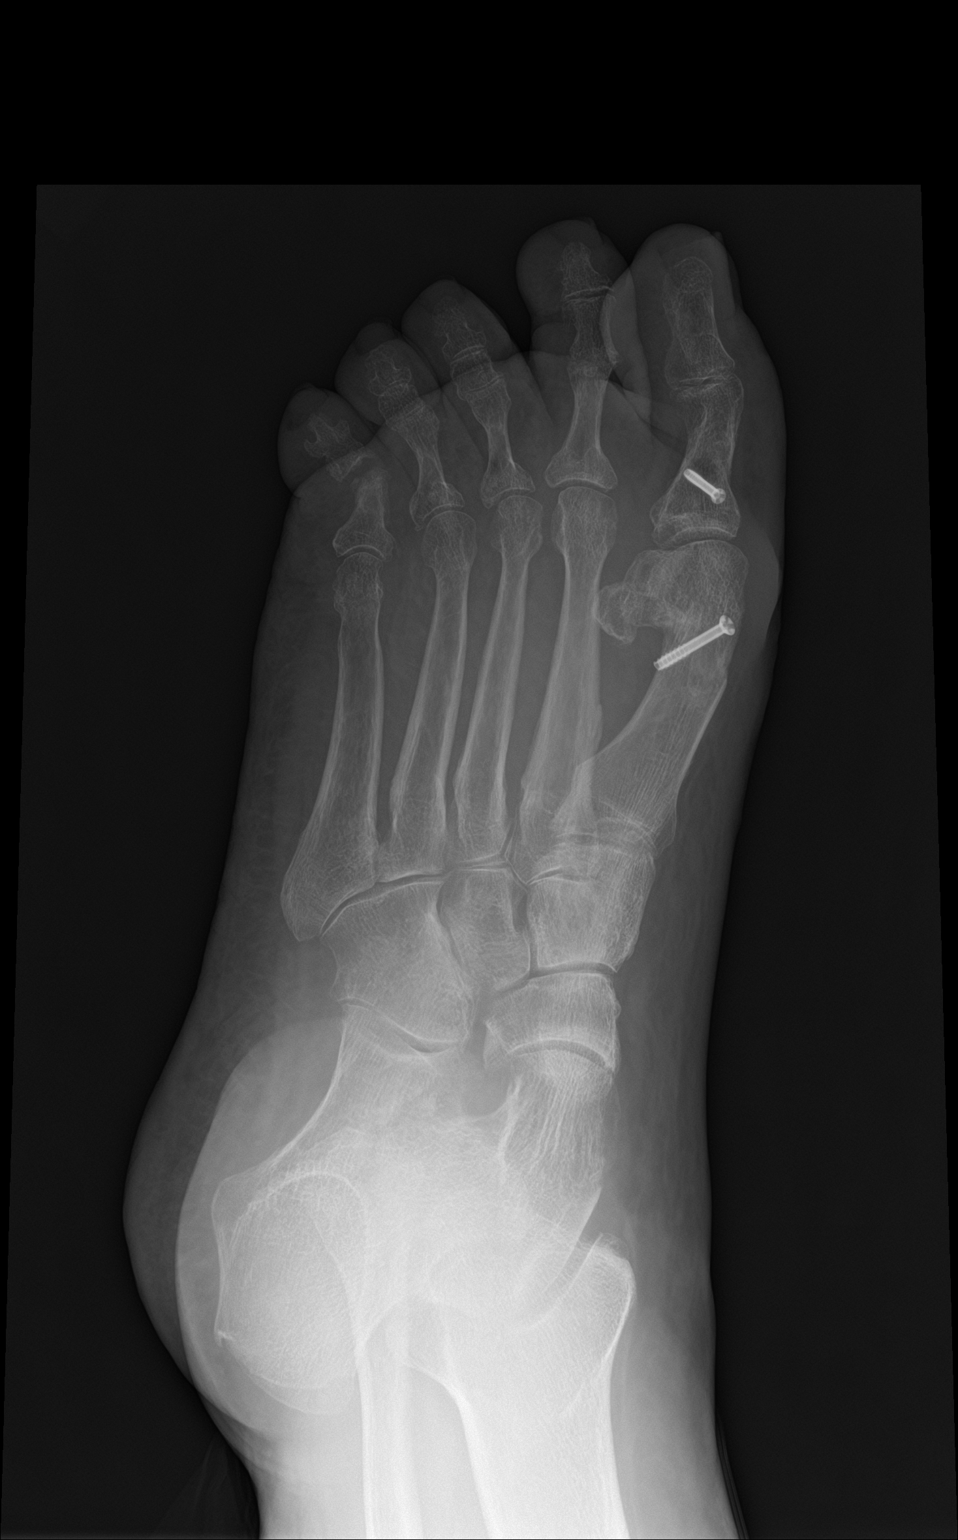

[foot lat]
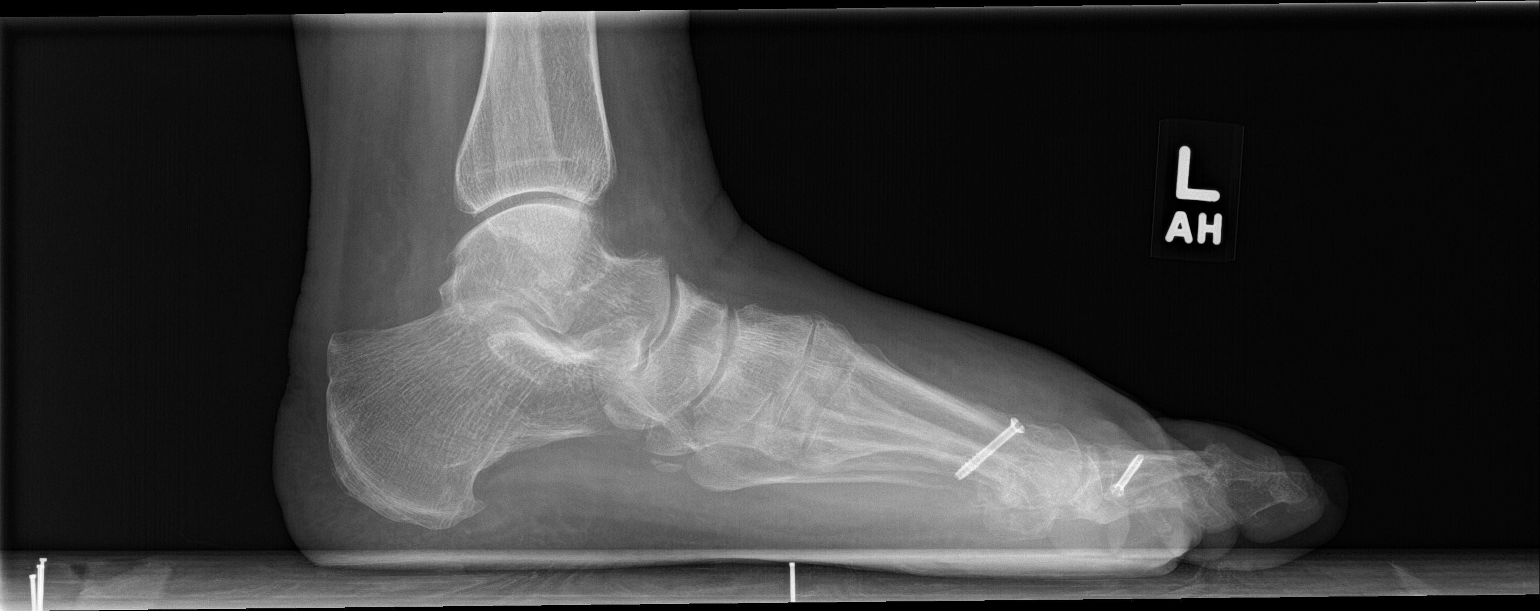

[3 of 3 positions shown; findings below may reference images not displayed]

FINDINGS: The second digit K- wire has been removed. There is evidence of
prior first metatarsal bony bunionectomy and possible osteotomies
involving the first metatarsal and proximal phalanx. Osteotomy sites
appear healed. Unchanged alignment compared to 12/18/15. Leg screws
are again noted in the first metatarsal and adjacent proximal
phalanx.

There is resorption of the head and proximal metaphysis of the
proximal phalanx of the small toe, as before. Generalized bony
demineralization with loss of interphalangeal articular space at
multiple levels.

Accessory navicular noted.

Os peroneus noted. Plantar calcaneal spur. Soft tissue swelling
along the dorsum of the forefoot.
IMPRESSION: 1. Appropriate healing response at the osteotomy sites and bony
bunionectomy site, no significant change in alignment.
2. Interval removal of the second digit K-wire without complicating
feature.
3. Stable appearance of resorption of the distal portion of the
proximal phalanx of the small toe. Interphalangeal articular space
loss as before.
4. Plantar calcaneal spur.
5. Subcutaneous edema along the dorsum of the forefoot distally.
